# Patient Record
Sex: Male | Born: 2005 | Race: White | Hispanic: No | Marital: Single | Smoking: Never smoker
Health system: Southern US, Community
[De-identification: ages and names within clinical notes are randomized; demographics above are authoritative.]

## PROBLEM LIST (undated history)

## (undated) DIAGNOSIS — F419 Anxiety disorder, unspecified: Secondary | ICD-10-CM

## (undated) DIAGNOSIS — K219 Gastro-esophageal reflux disease without esophagitis: Secondary | ICD-10-CM

## (undated) DIAGNOSIS — L309 Dermatitis, unspecified: Secondary | ICD-10-CM

## (undated) HISTORY — DX: Anxiety disorder, unspecified: F41.9

## (undated) HISTORY — DX: Gastro-esophageal reflux disease without esophagitis: K21.9

## (undated) HISTORY — DX: Dermatitis, unspecified: L30.9

## (undated) HISTORY — PX: NO PAST SURGERIES: SHX2092

---
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
## 2023-08-22 ENCOUNTER — Ambulatory Visit (INDEPENDENT_AMBULATORY_CARE_PROVIDER_SITE_OTHER): Payer: BLUE CROSS/BLUE SHIELD | Admitting: Internal Medicine

## 2023-08-22 ENCOUNTER — Encounter: Payer: Self-pay | Admitting: Internal Medicine

## 2023-08-22 VITALS — BP 120/72 | HR 80 | Temp 97.6°F | Resp 16 | Ht 73.0 in | Wt 152.2 lb

## 2023-08-22 DIAGNOSIS — Z23 Encounter for immunization: Secondary | ICD-10-CM | POA: Diagnosis not present

## 2023-08-22 DIAGNOSIS — Z0000 Encounter for general adult medical examination without abnormal findings: Secondary | ICD-10-CM | POA: Diagnosis not present

## 2023-08-22 MED ORDER — PANTOPRAZOLE SODIUM 40 MG PO TBEC: 40.0000 mg | DELAYED_RELEASE_TABLET | Freq: Every day | ORAL | 2 refills | Status: DC

## 2023-08-22 NOTE — Progress Notes (Signed)
Subjective:    Patient ID: John Hawkins, male    DOB: 2006/06/28, 17 y.o.   MRN: [id number]  DOS:  08/22/2023 Type of visit - description: CPX  Here for CPX. Feeling well. Today he did report that for the last few years he has cough almost daily, typically dry, they are random but more common after eating. He also reports occasional difficulty swallowing solids, not liquids. Denies nausea vomiting or diarrhea. No weight loss noted No heartburn per se  Wt Readings from Last 3 Encounters:  08/22/23 152 lb 4 oz (69.1 kg) (46%, Z= -0.09)*  08/20/22 149 lb 6 oz (67.8 kg) (48%, Z= -0.06)*  01/19/07 32 lb (14.5 kg) (53%, Z= 0.08)*   * Growth percentiles are based on CDC (Boys, 2-20 Years) data.     Review of Systems See above   History reviewed. No pertinent past medical history.  Past Surgical History:  Procedure Laterality Date   NO PAST SURGERIES     Social History   Socioeconomic History   Marital status: Single    Spouse name: Not on file   Number of children: Not on file   Years of education: Not on file   Highest education level: Not on file  Occupational History   Occupation: college- [employer]  Tobacco Use   Smoking status: Never   Smokeless tobacco: Never  Substance and Sexual Activity   Alcohol use: Never   Drug use: Not Currently   Sexual activity: Not Currently  Other Topics Concern   Not on file  Social History Narrative   Lives w/ his mother    Social Drivers of Health   Financial Resource Strain: Not on file  Food Insecurity: No Food Insecurity (10/16/2020)   Received from [facility], [facility]   Hunger Vital Sign    Worried About Running Out of Food in the Last Year: Never true    Ran Out of Food in the Last Year: Never true  Transportation Needs: Not on file  Physical Activity: Not on file  Stress: Not on file  Social Connections: Unknown (12/25/2021)   Received from [facility], [facility]   Social Network    Social  Network: Not on file  Intimate Partner Violence: Unknown (11/23/2021)   Received from [facility], [facility]   HITS    Physically Hurt: Not on file    Insult or Talk Down To: Not on file    Threaten Physical Harm: Not on file    Scream or Curse: Not on file     Current Outpatient Medications  Medication Instructions   pantoprazole (PROTONIX) 40 mg, Oral, Daily       Objective:   Physical Exam BP 120/72   Pulse 80   Temp 97.6 F (36.4 C) (Oral)   Resp 16   Ht 6\' 1"  (1.854 m)   Wt 152 lb 4 oz (69.1 kg)   SpO2 99%   BMI 20.09 kg/m  General: Well developed, NAD, BMI noted Neck: No  thyromegaly  HEENT:  Normocephalic . Face symmetric, atraumatic Lungs:  CTA B Normal respiratory effort, no intercostal retractions, no accessory muscle use. Heart: RRR,  no murmur.  Abdomen:  Not distended, soft, non-tender. No rebound or rigidity.   Lower extremities: no pretibial edema bilaterally  Skin: Exposed areas without rash. Not pale. Not jaundice Neurologic:  alert & oriented X3.  Speech normal, gait appropriate for age and unassisted Strength symmetric and appropriate for age.  Psych: Cognition  and judgment appear intact.  Cooperative with normal attention span and concentration.  Behavior appropriate. No anxious or depressed appearing.     Assessment     Assessment (new patient, referred by his mother) Vasovagal syncope: See OV note from 08/21/2022  PLAN Here for CPX Tdap 2016  Meningitis: Completed Bexsero: First dose 07/2022.  He fainted after 2 vaccines last year thus we will get a flu shot today and come back in 2 months for Bexsero. HPV: Completed Flu shot today, was observed for 5 for 6 minutes and he is feeling fine. COVID booster advisable Labs from last year reviewed.  No need for blood work today. Patient education: Diet exercise, STD, safe sex. Cough, dysphagia: Also for the last few years reports episodic cough and dysphagia, I wonder if he has  atypical GERD.  Plan: Pantoprazole daily for 2 months, he is to let me know if that helps or not.  He verbalized understanding. RTC Bexsero #2   in 2 months, nurse visit.  Watch for syncope. RTC CPX 1 year

## 2023-08-22 NOTE — Assessment & Plan Note (Signed)
Here for CPX Tdap 2016  Meningitis: Completed Bexsero: First dose 07/2022.  He fainted after 2 vaccines last year thus we will get a flu shot today and come back in 2 months for Bexsero. HPV: Completed Flu shot today, was observed for 5 for 6 minutes and he is feeling fine. COVID booster advisable Labs from last year reviewed.  No need for blood work today. Patient education: Diet exercise, STD, safe sex.

## 2023-08-22 NOTE — Patient Instructions (Signed)
Schedule a nurse visit to get Bexsero vaccine in 2 months. Next visit with me in 1 year for a physical exam Please schedule it at the front desk   Testicular Self-Exam A self-examination of your testicles (testicular self-exam) involves looking at and feeling your testicles for abnormal lumps or swelling. Several things can cause swelling, lumps, or pain in your testicles, including: Injuries. Inflammation. Infection. Buildup of fluids around the testicle (hydrocele). Twisted testicles (testicular torsion). Testicular cancer. You may be at risk for this if you have: A testicle that has not descended. Previously had testicular cancer. A family history of testicular cancer. General tips It is easiest to do a self-exam during or after a warm bath or shower. Testicles are harder to examine when you are cold because the muscles attached to the testicles retract and pull them up higher or into the abdomen. A normal testicle is egg-shaped and feels firm. It is smooth and not tender. It is normal to feel a firm, spaghetti-like cord at the back of your testicle. This is the spermatic cord. Do a self-exam once a month. How to do a testicular self-exam  Stand and hold your penis away from your body. Look at each testicle to check for changes in appearance, such as swelling or changes in size or shape. Roll each testicle between your thumb and forefinger, feeling the entire testicle. Feel for: Lumps. Swelling. Discomfort. Check for swelling or tender bumps in the groin area. Your groin is where your lower belly (abdomen) meets your upper thighs. Contact a health care provider if: You find a bump or lump. This may be a small, hard bump that is the size of a pea. You have swelling, pain, or soreness in your testicle area. You see or feel any other changes in your testicles. This information is not intended to replace advice given to you by your health care provider. Make sure you discuss  any questions you have with your health care provider. Document Revised: 05/24/2022 Document Reviewed: 05/24/2022 Elsevier Patient Education  2024 [employer].  Safe Sex Practicing safe sex means taking steps before and during sex to reduce your risk of: Getting an STI (sexually transmitted infection). Giving your partner an STI. Unwanted or unplanned pregnancy.  How to practice safe sex Ways you can practice safe sex  Limit your sexual partners to only one partner who is having sex with only you. Avoid using alcohol and drugs before having sex. Alcohol and drugs can affect your judgment. Before having sex with a new partner: Talk to your partner about past partners, past STIs, and drug use. Get screened for STIs and discuss the results with your partner. Ask your partner to get screened too. Check your body regularly for sores, blisters, rashes, or unusual discharge. If you notice any of these problems, visit your health care provider. Avoid sexual contact if you have symptoms of an infection or you are being treated for an STI. While having sex, use a condom. Make sure to: Use a condom every time you have vaginal, oral, or anal sex. Both females and males should wear condoms during oral sex. Keep condoms in place from the beginning to the end of sexual activity. Use a latex condom, if possible. Latex condoms offer the best protection. Use only water-based lubricants with a condom. Using petroleum-based lubricants or oils will weaken the condom and increase the chance that it will break. Ways your health care provider can help you practice safe sex  See  your health care provider for regular screenings, exams, and tests for STIs. Talk with your health care provider about what kind of birth control (contraception) is best for you. Get vaccinated against hepatitis B and human papillomavirus (HPV). If you are at risk of being infected with HIV (human immunodeficiency virus), talk with  your health care provider about taking a prescription medicine to prevent HIV infection. You are at risk for HIV if you: Are a man who has sex with other men. Are sexually active with more than one partner. Take drugs by injection. Have a sex partner who has HIV. Have unprotected sex. Have sex with someone who has sex with both men and women. Have had an STI. Follow these instructions at home: Take over-the-counter and prescription medicines only as told by your health care provider. Keep all follow-up visits. This is important. Where to find more information Centers for Disease Control and Prevention: FootballExhibition.com.br Planned Parenthood: www.plannedparenthood.org Office on [employer] Health: http://hoffman.com/ Summary Practicing safe sex means taking steps before and during sex to reduce your risk getting an STI, giving your partner an STI, and having an unwanted or unplanned pregnancy. Before having sex with a new partner, talk to your partner about past partners, past STIs, and drug use. Use a condom every time you have vaginal, oral, or anal sex. Both females and males should wear condoms during oral sex. Check your body regularly for sores, blisters, rashes, or unusual discharge. If you notice any of these problems, visit your health care provider. See your health care provider for regular screenings, exams, and tests for STIs. This information is not intended to replace advice given to you by your health care provider. Make sure you discuss any questions you have with your health care provider. Document Revised: 01/14/2020 Document Reviewed: 01/14/2020 Elsevier Patient Education  2024 [employer].

## 2023-08-22 NOTE — Assessment & Plan Note (Signed)
Here for CPX  Cough, dysphagia: Also for the last few years reports episodic cough and dysphagia, I wonder if he has atypical GERD.  Plan: Pantoprazole daily for 2 months, he is to let me know if that helps or not.  He verbalized understanding. RTC Bexsero #2   in 2 months, nurse visit.  Watch for syncope. RTC CPX 1 year

## 2023-10-21 ENCOUNTER — Ambulatory Visit (INDEPENDENT_AMBULATORY_CARE_PROVIDER_SITE_OTHER): Payer: BLUE CROSS/BLUE SHIELD | Admitting: *Deleted

## 2023-10-21 DIAGNOSIS — Z23 Encounter for immunization: Secondary | ICD-10-CM

## 2023-10-21 NOTE — Progress Notes (Signed)
 Pt here for Bexsero vaccine per PCP order. Given RD.  Observed pt for possible syncope for 5-7 min.  Pt tolerated well.

## 2024-08-22 ENCOUNTER — Encounter: Payer: BLUE CROSS/BLUE SHIELD | Admitting: Internal Medicine

## 2024-08-24 ENCOUNTER — Ambulatory Visit: Admitting: Internal Medicine

## 2024-08-24 ENCOUNTER — Encounter: Payer: Self-pay | Admitting: Internal Medicine

## 2024-08-24 VITALS — BP 118/74 | HR 88 | Temp 97.7°F | Resp 18 | Ht 73.0 in | Wt 158.2 lb

## 2024-08-24 DIAGNOSIS — Z0000 Encounter for general adult medical examination without abnormal findings: Secondary | ICD-10-CM | POA: Diagnosis not present

## 2024-08-24 DIAGNOSIS — Z23 Encounter for immunization: Secondary | ICD-10-CM | POA: Diagnosis not present

## 2024-08-24 LAB — BASIC METABOLIC PANEL WITH GFR
BUN: 15 mg/dL (ref 6–23)
CO2: 31 meq/L (ref 19–32)
Calcium: 9.5 mg/dL (ref 8.4–10.5)
Chloride: 102 meq/L (ref 96–112)
Creatinine, Ser: 0.99 mg/dL (ref 0.40–1.50)
GFR: 109.57 mL/min
Glucose, Bld: 59 mg/dL — ABNORMAL LOW (ref 70–99)
Potassium: 4.2 meq/L (ref 3.5–5.1)
Sodium: 141 meq/L (ref 135–145)

## 2024-08-24 LAB — CBC WITH DIFFERENTIAL/PLATELET
Basophils Absolute: 0 K/uL (ref 0.0–0.1)
Basophils Relative: 0.6 % (ref 0.0–3.0)
Eosinophils Absolute: 0.1 K/uL (ref 0.0–0.7)
Eosinophils Relative: 1.8 % (ref 0.0–5.0)
HCT: 46.5 % (ref 39.0–52.0)
Hemoglobin: 15.7 g/dL (ref 13.0–17.0)
Lymphocytes Relative: 29.3 % (ref 12.0–46.0)
Lymphs Abs: 1.6 K/uL (ref 0.7–4.0)
MCHC: 33.8 g/dL (ref 30.0–36.0)
MCV: 87.2 fl (ref 78.0–100.0)
Monocytes Absolute: 0.4 K/uL (ref 0.1–1.0)
Monocytes Relative: 6.6 % (ref 3.0–12.0)
Neutro Abs: 3.5 K/uL (ref 1.4–7.7)
Neutrophils Relative %: 61.7 % (ref 43.0–77.0)
Platelets: 241 K/uL (ref 150.0–400.0)
RBC: 5.33 Mil/uL (ref 4.22–5.81)
RDW: 13.3 % (ref 11.5–14.6)
WBC: 5.6 K/uL (ref 4.5–10.5)

## 2024-08-24 NOTE — Progress Notes (Signed)
" ° °  Subjective:    Patient ID: John Hawkins, male    DOB: 2005/09/30, 18 y.o.   MRN: [id number]  DOS:  08/24/2024 CPX   Discussed the use of AI scribe software for clinical note transcription with the patient, who gave verbal consent to proceed.  History of Present Illness John Hawkins is a 19 year old male who presents for an annual physical exam.  General health status - No new concerns since last visit - No chest pain, dyspnea, gastrointestinal symptoms, or urinary issues  Review of Systems  A 14 point review of systems is negative   History reviewed. No pertinent past medical history.  Past Surgical History:  Procedure Laterality Date   NO PAST SURGERIES      No current outpatient medications      Objective:   Physical Exam BP 118/74   Pulse 88   Temp 97.7 F (36.5 C)   Resp 18   Ht 6' 1 (1.854 m)   Wt 158 lb 3.2 oz (71.8 kg)   SpO2 98%   BMI 20.87 kg/m  General: Well developed, NAD, BMI noted Neck: No  thyromegaly  HEENT:  Normocephalic . Face symmetric, atraumatic Lungs:  CTA B Normal respiratory effort, no intercostal retractions, no accessory muscle use. Heart: RRR,  no murmur.  Abdomen:  Not distended, soft, non-tender. No rebound or rigidity.   Lower extremities: no pretibial edema bilaterally  Skin: Exposed areas without rash. Not pale. Not jaundice Neurologic:  alert & oriented X3.  Speech normal, gait appropriate for age and unassisted Strength symmetric and appropriate for age.  Psych: Cognition and judgment appear intact.  Cooperative with normal attention span and concentration.  Behavior appropriate. No anxious or depressed appearing.     Assessment   Assessment (new patient, referred by his mother) Vasovagal syncope: See OV note from 08/21/2022  PLAN  Here for CPX Tdap 2016  Meningitis, Bexsero, HPV: Completed Flu shot  today. COVID booster discussed with the patient. Labs: HIV, hep C, CBC and BMP. Patient education:  Diet exercise, STE encouraged, safe sex. Social: Attends [employer] in a [employer] Lives with his mother No alcohol or tobacco use RTC 1 year     "

## 2024-08-24 NOTE — Patient Instructions (Signed)
 GO TO THE LAB :  Get the blood work    Then, go to the front desk for the checkout Please make an appointment for physical exam in 1 year

## 2024-08-25 LAB — HEPATITIS C ANTIBODY: Hepatitis C Ab: NONREACTIVE

## 2024-08-25 LAB — HIV ANTIBODY (ROUTINE TESTING W REFLEX)
HIV 1&2 Ab, 4th Generation: NONREACTIVE
HIV FINAL INTERPRETATION: NEGATIVE

## 2024-08-26 ENCOUNTER — Encounter: Payer: Self-pay | Admitting: Internal Medicine

## 2024-08-26 NOTE — Assessment & Plan Note (Signed)
Here for CPX RTC 1 year

## 2024-08-26 NOTE — Assessment & Plan Note (Signed)
 Here for CPX Tdap 2016  Meningitis, Bexsero, HPV: Completed Flu shot  today. COVID booster discussed with the patient. Labs: HIV, hep C, CBC and BMP. Patient education: Diet exercise, STE encouraged, safe sex. Social: Attends [employer] in a armed [employer] Lives with his mother No alcohol or tobacco use

## 2024-08-29 ENCOUNTER — Ambulatory Visit: Payer: Self-pay | Admitting: Internal Medicine

## 2025-08-26 ENCOUNTER — Encounter: Admitting: Internal Medicine
